# Patient Record
Sex: Male | Born: 1980 | Race: White | Hispanic: No | Marital: Married | Smoking: Current some day smoker
Health system: Southern US, Community
[De-identification: ages and names within clinical notes are randomized; demographics above are authoritative.]

## PROBLEM LIST (undated history)

## (undated) DIAGNOSIS — Q8789 Other specified congenital malformation syndromes, not elsewhere classified: Secondary | ICD-10-CM

## (undated) DIAGNOSIS — B1920 Unspecified viral hepatitis C without hepatic coma: Secondary | ICD-10-CM

## (undated) DIAGNOSIS — F1910 Other psychoactive substance abuse, uncomplicated: Secondary | ICD-10-CM

## (undated) DIAGNOSIS — G47419 Narcolepsy without cataplexy: Secondary | ICD-10-CM

## (undated) DIAGNOSIS — E785 Hyperlipidemia, unspecified: Secondary | ICD-10-CM

## (undated) DIAGNOSIS — Z9884 Bariatric surgery status: Secondary | ICD-10-CM

## (undated) DIAGNOSIS — L700 Acne vulgaris: Secondary | ICD-10-CM

## (undated) DIAGNOSIS — D72825 Bandemia: Secondary | ICD-10-CM

## (undated) DIAGNOSIS — E611 Iron deficiency: Secondary | ICD-10-CM

## (undated) DIAGNOSIS — R739 Hyperglycemia, unspecified: Secondary | ICD-10-CM

---

## 2002-10-22 ENCOUNTER — Emergency Department (HOSPITAL_COMMUNITY): Admission: EM | Admit: 2002-10-22 | Discharge: 2002-10-22 | Payer: Self-pay | Admitting: Emergency Medicine

## 2002-10-23 ENCOUNTER — Emergency Department (HOSPITAL_COMMUNITY): Admission: EM | Admit: 2002-10-23 | Discharge: 2002-10-23 | Payer: Self-pay | Admitting: Emergency Medicine

## 2005-05-18 ENCOUNTER — Emergency Department (HOSPITAL_COMMUNITY): Admission: EM | Admit: 2005-05-18 | Discharge: 2005-05-18 | Payer: Self-pay | Admitting: Emergency Medicine

## 2008-10-31 ENCOUNTER — Emergency Department (HOSPITAL_COMMUNITY): Admission: EM | Admit: 2008-10-31 | Discharge: 2008-10-31 | Payer: Self-pay | Admitting: Emergency Medicine

## 2016-01-07 ENCOUNTER — Other Ambulatory Visit: Payer: Self-pay | Admitting: Gastroenterology

## 2016-01-07 ENCOUNTER — Other Ambulatory Visit (HOSPITAL_COMMUNITY): Payer: Self-pay | Admitting: Gastroenterology

## 2016-01-07 DIAGNOSIS — B182 Chronic viral hepatitis C: Secondary | ICD-10-CM

## 2016-02-18 ENCOUNTER — Ambulatory Visit (HOSPITAL_COMMUNITY)
Admission: RE | Admit: 2016-02-18 | Discharge: 2016-02-18 | Disposition: A | Discharge status: Home / Self Care | Source: Ambulatory Visit | Attending: Gastroenterology | Admitting: Gastroenterology

## 2016-02-18 DIAGNOSIS — B182 Chronic viral hepatitis C: Secondary | ICD-10-CM

## 2016-03-24 ENCOUNTER — Ambulatory Visit (HOSPITAL_COMMUNITY)

## 2016-04-05 ENCOUNTER — Ambulatory Visit (HOSPITAL_COMMUNITY)
Admission: RE | Admit: 2016-04-05 | Discharge: 2016-04-05 | Disposition: A | Discharge status: Home / Self Care | Payer: Medicare Other | Source: Ambulatory Visit | Attending: Gastroenterology | Admitting: Gastroenterology

## 2016-04-05 DIAGNOSIS — B182 Chronic viral hepatitis C: Secondary | ICD-10-CM | POA: Diagnosis present

## 2017-07-23 ENCOUNTER — Emergency Department (HOSPITAL_COMMUNITY)
Admission: EM | Admit: 2017-07-23 | Discharge: 2017-07-23 | Disposition: A | Discharge status: Home / Self Care | Payer: Medicare Other | Attending: Emergency Medicine | Admitting: Emergency Medicine

## 2017-07-23 ENCOUNTER — Encounter (HOSPITAL_COMMUNITY): Payer: Self-pay | Admitting: Emergency Medicine

## 2017-07-23 ENCOUNTER — Emergency Department (HOSPITAL_COMMUNITY): Payer: Medicare Other

## 2017-07-23 DIAGNOSIS — R4182 Altered mental status, unspecified: Secondary | ICD-10-CM | POA: Diagnosis present

## 2017-07-23 DIAGNOSIS — Z79899 Other long term (current) drug therapy: Secondary | ICD-10-CM | POA: Diagnosis not present

## 2017-07-23 DIAGNOSIS — E785 Hyperlipidemia, unspecified: Secondary | ICD-10-CM | POA: Diagnosis not present

## 2017-07-23 DIAGNOSIS — R400 Somnolence: Secondary | ICD-10-CM | POA: Diagnosis not present

## 2017-07-23 HISTORY — DX: Hyperlipidemia, unspecified: E78.5

## 2017-07-23 HISTORY — DX: Iron deficiency: E61.1

## 2017-07-23 HISTORY — DX: Bariatric surgery status: Z98.84

## 2017-07-23 HISTORY — DX: Bandemia: D72.825

## 2017-07-23 HISTORY — DX: Hyperglycemia, unspecified: R73.9

## 2017-07-23 HISTORY — DX: Other specified congenital malformation syndromes, not elsewhere classified: Q87.89

## 2017-07-23 HISTORY — DX: Unspecified viral hepatitis C without hepatic coma: B19.20

## 2017-07-23 HISTORY — DX: Other psychoactive substance abuse, uncomplicated: F19.10

## 2017-07-23 HISTORY — DX: Narcolepsy without cataplexy: G47.419

## 2017-07-23 HISTORY — DX: Acne vulgaris: L70.0

## 2017-07-23 LAB — CBC WITH DIFFERENTIAL/PLATELET
Basophils Absolute: 0 10*3/uL (ref 0.0–0.1)
Basophils Relative: 0 %
Eosinophils Absolute: 0.3 10*3/uL (ref 0.0–0.7)
Eosinophils Relative: 5 %
HCT: 38.5 % — ABNORMAL LOW (ref 39.0–52.0)
Hemoglobin: 12 g/dL — ABNORMAL LOW (ref 13.0–17.0)
Lymphocytes Relative: 33 %
Lymphs Abs: 1.9 10*3/uL (ref 0.7–4.0)
MCH: 26.9 pg (ref 26.0–34.0)
MCHC: 31.2 g/dL (ref 30.0–36.0)
MCV: 86.3 fL (ref 78.0–100.0)
Monocytes Absolute: 0.3 10*3/uL (ref 0.1–1.0)
Monocytes Relative: 6 %
Neutro Abs: 3.3 10*3/uL (ref 1.7–7.7)
Neutrophils Relative %: 56 %
Platelets: 265 10*3/uL (ref 150–400)
RBC: 4.46 MIL/uL (ref 4.22–5.81)
RDW: 13.9 % (ref 11.5–15.5)
WBC: 5.8 10*3/uL (ref 4.0–10.5)

## 2017-07-23 LAB — COMPREHENSIVE METABOLIC PANEL
ALT: 20 U/L (ref 17–63)
AST: 31 U/L (ref 15–41)
Albumin: 3.4 g/dL — ABNORMAL LOW (ref 3.5–5.0)
Alkaline Phosphatase: 80 U/L (ref 38–126)
Anion gap: 3 — ABNORMAL LOW (ref 5–15)
BUN: 15 mg/dL (ref 6–20)
CO2: 30 mmol/L (ref 22–32)
Calcium: 8.4 mg/dL — ABNORMAL LOW (ref 8.9–10.3)
Chloride: 106 mmol/L (ref 101–111)
Creatinine, Ser: 1.09 mg/dL (ref 0.61–1.24)
GFR calc Af Amer: >60 mL/min (ref >60)
GFR calc non Af Amer: >60 mL/min (ref >60)
Glucose, Bld: 100 mg/dL — ABNORMAL HIGH (ref 65–99)
Potassium: 4 mmol/L (ref 3.5–5.1)
Sodium: 139 mmol/L (ref 135–145)
Total Bilirubin: 0.8 mg/dL (ref 0.3–1.2)
Total Protein: 6.3 g/dL — ABNORMAL LOW (ref 6.5–8.1)

## 2017-07-23 LAB — LACTIC ACID, PLASMA: Lactic Acid, Venous: 1.6 mmol/L (ref 0.5–1.9)

## 2017-07-23 LAB — CBG MONITORING, ED: Glucose-Capillary: 78 mg/dL (ref 65–99)

## 2017-07-23 LAB — AMMONIA: Ammonia: 27 umol/L (ref 9–35)

## 2017-07-23 MED ORDER — NALOXONE HCL 2 MG/2ML IJ SOSY
PREFILLED_SYRINGE | INTRAMUSCULAR | Status: AC
  Administered 2017-07-23: 2 mg
  Filled 2017-07-23: qty 2

## 2017-07-23 MED ORDER — SODIUM CHLORIDE 0.9 % IV SOLN: INTRAVENOUS | Status: DC

## 2017-07-23 MED ORDER — SODIUM CHLORIDE 0.9 % IV BOLUS (SEPSIS)
1000.0000 mL | Freq: Once | INTRAVENOUS | Status: AC
  Administered 2017-07-23: 1000 mL via INTRAVENOUS

## 2017-07-23 NOTE — ED Notes (Signed)
Patient able to ambulate independently, still somnolent

## 2017-07-23 NOTE — ED Triage Notes (Signed)
Per EMS:  Pt presents to the ED for assessment after family called for cardiac arrest.  Patient in respiratory arrest with strong HR with fire arrival.  IukaKing airway inserted by fire with 2mg  of Narcan given.  Patient given 5 of versed en route because he was fighting the tube.  Patient arrives breathing on his own, no tube in place, HR 100, BP 120/80, 99% on NRB.  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

## 2017-07-23 NOTE — ED Notes (Signed)
Pt provided with water, okay'd by EDP.  Monitored patient while drinking, no difficulties

## 2017-07-23 NOTE — ED Provider Notes (Signed)
[facility] EMERGENCY DEPARTMENT Provider Note   CSN: [id number] Arrival date & time: 07/23/17  1551     History   Chief Complaint Chief Complaint  Patient presents with  . unresponsive    HPI Seth Lopez is a 36 y.o. male.  The history is provided by the patient and the EMS personnel.  Altered Mental Status   This is a new problem. The current episode started less than 1 hour ago. The problem has been gradually improving. Associated symptoms include unresponsiveness. Associated symptoms comments: Respiratory arrest per FD. Risk factors include illicit drug use. Past medical history comments: IVDU, HepC.    Past Medical History:  Diagnosis Date  . Bandemia   . Cystic acne   . Drug abuse (HCC)   . Dyslipidemia   . Gastric bypass status for obesity   . Hepatitis C   . Hyperglycemia   . Iron deficiency   . Klein's syndrome (HCC)   . Narcolepsy     There are no active problems to display for this patient.   History reviewed. No pertinent surgical history.     Home Medications    Prior to Admission medications   Medication Sig Start Date End Date Taking? Authorizing Provider  amphetamine-dextroamphetamine (ADDERALL) 20 MG tablet Take 20-40 mg by mouth See admin instructions. 40mg  in the morning and 20mg  late afternoon 07/19/17  Yes [provider]  folic acid (FOLVITE) 1 MG tablet Take 1 mg by mouth daily. 06/20/17  Yes [provider]  NUVIGIL 150 MG tablet Take 300 mg by mouth daily. 05/23/17  Yes [provider]  omeprazole (PRILOSEC) 40 MG capsule Take 40 mg by mouth daily. 06/13/17  Yes [provider]  POLY-IRON 150 150 MG capsule Take 1 capsule by mouth 2 (two) times daily. 05/22/17  Yes [provider]    Family History History reviewed. No pertinent family history.  Social History Social History   Tobacco Use  . Smoking status: Unknown If Ever Smoked  Substance Use Topics  .  Alcohol use: Not on file    Comment: unknown  . Drug use: Yes    Types: Methamphetamines     Allergies   Hydromorphone   Review of Systems Review of Systems  Unable to perform ROS: Mental status change     Physical Exam Updated Vital Signs BP 114/70   Pulse 76   Resp 13   SpO2 99%   Physical Exam  Constitutional: He appears well-developed and well-nourished.  HENT:  Head: Normocephalic and atraumatic.  Eyes: Conjunctivae are normal.  Pupils 1mm b/l  Neck: Neck supple.  Cardiovascular: Regular rhythm.  No murmur heard. Tachycardic  Pulmonary/Chest: Effort normal and breath sounds normal. No respiratory distress.  Abdominal: Soft. There is no tenderness.  Musculoskeletal: He exhibits no edema.  Neurological:  Sedated, minimally responsive to voice, moves all 4ext  Skin: Skin is warm and dry.  Psychiatric: He has a normal mood and affect.  Nursing note and vitals reviewed.    ED Treatments / Results  Labs (all labs ordered are listed, but only abnormal results are displayed) Labs Reviewed  COMPREHENSIVE METABOLIC PANEL - Abnormal; Notable for the following components:      Result Value   Glucose, Bld 100 (*)    Calcium 8.4 (*)    Total Protein 6.3 (*)    Albumin 3.4 (*)    Anion gap 3 (*)    All other components within normal limits  CBC WITH  DIFFERENTIAL/PLATELET - Abnormal; Notable for the following components:   Hemoglobin 12.0 (*)    HCT 38.5 (*)    All other components within normal limits  AMMONIA  LACTIC ACID, PLASMA  URINALYSIS, COMPLETE (UACMP) WITH MICROSCOPIC  CBG MONITORING, ED    EKG  EKG Interpretation  Date/Time:  Sunday July 23 2017 15:53:28 EST Ventricular Rate:  100 PR Interval:    QRS Duration: 89 QT Interval:  342 QTC Calculation: 442 R Axis:   87 Text Interpretation:  Sinus tachycardia Confirmed by Blane OharaZavitz, Joshua [phone]) on 07/23/2017 4:09:24 PM       Radiology Dg Chest Portable 1 View  Result Date:  07/23/2017 CLINICAL DATA:  Cardiac arrest.  Narcan given. EXAM: PORTABLE CHEST 1 VIEW COMPARISON:  06/01/2016 FINDINGS: Lungs are somewhat hypoinflated and otherwise clear. Cardiomediastinal silhouette, bones and soft tissues are within normal. IMPRESSION: No active disease. Electronically Signed   By: Elberta Fortisaniel  Boyle M.D.   On: 07/23/2017 16:45    Procedures Procedures (including critical care time)  Medications Ordered in ED Medications  sodium chloride 0.9 % bolus 1,000 mL (0 mLs Intravenous Stopped 07/23/17 1758)    And  0.9 %  sodium chloride infusion (not administered)  naloxone (NARCAN) 2 MG/2ML injection (2 mg  Given 07/23/17 1557)     Initial Impression / Assessment and Plan / ED Course  I have reviewed the triage vital signs and the nursing notes.  Pertinent labs & imaging results that were available during my care of the patient were reviewed by me and considered in my medical decision making (see chart for details).     Pt with h/o IVDU, HepC presents after respiratory arrest. Family reportedly came home and found that the Pt wasn't breathing; they called 911 and FD was sent out for presumed cardiac arrest. When FD arrived the Pt had a pulse, but was in reportedly in respiratory arrest; they inserted a King airway & gave 2mg  Narcan which improved his respiratory status. When EMS arrived, the Pt was still minimally responsive, but when they loaded him into the ambulance, he sat upright with the [facility] in [facility], so they gave 5mg  Versed to sedate him; the Brooke DareKing was removed & the Pt was saturating well on RA, but they placed him on an NRB per protocol.  VS & exam as above. Pt minimally responsive on arrival, so 2mg  Narcan given in the ED; questionable response, but became more talkative. EKG: ST @ 100bpm w/normal intervals and no signs of ischemia.  NS bolus & MIVF started. Mom arrived in the ED & provided PMHx (narcolepsy & Kleine-Levin syndrome) and med list. She says the Pt's friend  reportedly gave him "a Valium" today and is concerned that this might be what contributed to his presentation.  CXR WNL. Labs unremarkable.  On re-evaluation, Pt still sleepy, but easily arousible. Says that he's just sleepy and that this isn't uncommon given his condition which his parents confirm. Do not believe there is any emergent condition that required intervention at this time; presentation likely related to his chronic condition & might have been exacerbated by the benzodiazepine.  Explained all results to the Pt & parents. Will discharge the Pt home. Recommending follow-up with PCP. ED return precautions provided. Pt acknowledged understanding of, and concurrence with the plan. All questions answered to his satisfaction. In stable condition at the time of discharge.  Final Clinical Impressions(s) / ED Diagnoses   Final diagnoses:  Somnolence    ED Discharge Orders  None       Forest BeckerPetit, Juliauna Stueve, MD 07/23/17 Gayla Medicus1949    Blane OharaZavitz, Joshua, MD 07/23/17 [phone]

## 2017-07-23 NOTE — ED Notes (Addendum)
Response to narcan includes patient asking for water and being more alert.  No distinct response.  Patient denies any pain, denies any other symptoms prior to incident except "laying in bed all day" and "feeling nauseous".  Pt states "I didn't do anything to cause this".  Patient asking why he has received narcan, explained, patient verbalized understanding.

## 2017-07-23 NOTE — ED Notes (Signed)
Patient continues to ask for water.  Made aware that he cannot drink.   Patient;'s mother here.  Pt verbalized okay for mother to come to room.

## 2017-12-27 ENCOUNTER — Other Ambulatory Visit: Payer: Self-pay

## 2017-12-27 ENCOUNTER — Encounter (HOSPITAL_COMMUNITY): Payer: Self-pay

## 2017-12-27 ENCOUNTER — Emergency Department (HOSPITAL_COMMUNITY)
Admission: EM | Admit: 2017-12-27 | Discharge: 2017-12-27 | Disposition: A | Discharge status: Home / Self Care | Payer: Medicare Other | Attending: Emergency Medicine | Admitting: Emergency Medicine

## 2017-12-27 DIAGNOSIS — Z79899 Other long term (current) drug therapy: Secondary | ICD-10-CM | POA: Diagnosis not present

## 2017-12-27 DIAGNOSIS — Z046 Encounter for general psychiatric examination, requested by authority: Secondary | ICD-10-CM | POA: Insufficient documentation

## 2017-12-27 DIAGNOSIS — R4182 Altered mental status, unspecified: Secondary | ICD-10-CM | POA: Diagnosis present

## 2017-12-27 DIAGNOSIS — F1910 Other psychoactive substance abuse, uncomplicated: Secondary | ICD-10-CM | POA: Diagnosis not present

## 2017-12-27 DIAGNOSIS — Z9884 Bariatric surgery status: Secondary | ICD-10-CM | POA: Diagnosis not present

## 2017-12-27 DIAGNOSIS — F17210 Nicotine dependence, cigarettes, uncomplicated: Secondary | ICD-10-CM | POA: Insufficient documentation

## 2017-12-27 NOTE — ED Triage Notes (Signed)
Per EMS- Patient reports that he did LSD and is not sure if he did any meth. Patient paranoid and could not remember where his car was. Patient was in a neighborhood banging on doors and going through [employer]. [employer] was called.

## 2017-12-27 NOTE — ED Provider Notes (Signed)
[facility]-EMERGENCY DEPT Provider Note   CSN: [id number] Arrival date & time: 12/27/17  0815     History   Chief Complaint Chief Complaint  Patient presents with  . Altered Mental Status    HPI Seth Lopez is a 37 y.o. male.  HPI   Patient brought in by police because he was banging on doors.  He was up all night, doing "drugs,", and states that he had some "bad drugs."  He denies headache, chest pain, weakness, dizziness, nausea or vomiting.  He states he is currently visiting [place], and spends time both in [place] and [place].  Expresses no additional desires or concern.  Law enforcement is not pursuing charges against the patient.  There are no other no modifying factors.  Past Medical History:  Diagnosis Date  . Bandemia   . Cystic acne   . Drug abuse (HCC)   . Dyslipidemia   . Gastric bypass status for obesity   . Hepatitis C   . Hyperglycemia   . Iron deficiency   . Klein's syndrome   . Narcolepsy     There are no active problems to display for this patient.   History reviewed. No pertinent surgical history.      Home Medications    Prior to Admission medications   Medication Sig Start Date End Date Taking? Authorizing Provider  amphetamine-dextroamphetamine (ADDERALL) 20 MG tablet Take 20-40 mg by mouth See admin instructions.  in the morning and  late afternoon 07/19/17   [provider]  folic acid (FOLVITE) 1 MG tablet Take 1 mg by mouth daily. 06/20/17   [provider]  NUVIGIL 150 MG tablet Take 300 mg by mouth daily. 05/23/17   [provider]  omeprazole (PRILOSEC) 40 MG capsule Take 40 mg by mouth daily. 06/13/17   [provider]  POLY-IRON 150 150 MG capsule Take 1 capsule by mouth 2 (two) times daily. 05/22/17   [provider]    Family History History reviewed. No pertinent family history.  Social History Social History   Tobacco  Use  . Smoking status: Current Some Day Smoker    Types: Cigarettes  . Smokeless tobacco: Never Used  Substance Use Topics  . Alcohol use: Not Currently    Comment: unknown  . Drug use: Yes    Types: Methamphetamines, IV    Comment: Patient admits to doing LSD as well     Allergies   Hydromorphone   Review of Systems Review of Systems  All other systems reviewed and are negative.    Physical Exam Updated Vital Signs BP (!) 143/94 (BP Location: Left Arm)   Pulse (!) 105   Temp 98 F (36.7 C) (Oral)   Resp 17   Ht  (1.854 m)   Wt 79.4 kg (175 lb)   SpO2 99%   BMI 23.09 kg/m   Physical Exam  Constitutional: He is oriented to person, place, and time. He appears well-developed and well-nourished. No distress.  HENT:  Head: Normocephalic and atraumatic.  Right Ear: External ear normal.  Left Ear: External ear normal.  Eyes: Pupils are equal, round, and reactive to light. Conjunctivae and EOM are normal.  Neck: Normal range of motion and phonation normal. Neck supple.  Cardiovascular: Normal rate.  Pulmonary/Chest: Effort normal. He exhibits no bony tenderness.  Musculoskeletal: Normal range of motion.  Neurological: He is alert and oriented to person, place, and time. No cranial nerve deficit or sensory deficit.  He exhibits normal muscle tone. Coordination normal.  Lucid.  No dysarthria or aphasia.  Skin: Skin is warm, dry and intact.  Psychiatric: He has a normal mood and affect. His behavior is normal. Judgment and thought content normal.  Cooperative and interactive  Nursing note and vitals reviewed.    ED Treatments / Results  Labs (all labs ordered are listed, but only abnormal results are displayed) Labs Reviewed - No data to display  EKG None  Radiology No results found.  Procedures Procedures (including critical care time)  Medications Ordered in ED Medications - No data to display   Initial Impression / Assessment and Plan / ED Course    I have reviewed the triage vital signs and the nursing notes.  Pertinent labs & imaging results that were available during my care of the patient were reviewed by me and considered in my medical decision making (see chart for details).      Patient Vitals for the past 24 hrs:  BP Temp Temp src Pulse Resp SpO2 Height Weight  12/27/17 0840 - - - - - -  (1.854 m) 79.4 kg (175 lb)  12/27/17 0825 - - - - - 99 % - -  12/27/17 0820 (!) 143/94 98 F (36.7 C) Oral (!) 105 17 100 % - -    9:16 AM Reevaluation with update and discussion. After initial assessment and treatment, an updated evaluation reveals no change in status, findings discussed with patient and all questions answered. Mancel Bale   Medical Decision Making: Intoxication with psychoactive medication.  Patient has improved after arrival to the ED and there is no indication for further evaluation and treatment.  CRITICAL CARE-no Performed by: Mancel Bale  Nursing Notes Reviewed/ Care Coordinated Applicable Imaging Reviewed Interpretation of Laboratory Data incorporated into ED treatment  The patient appears reasonably screened and/or stabilized for discharge and I doubt any other medical condition or other [facility] requiring further screening, evaluation, or treatment in the ED at this time prior to discharge.  Plan: Home Medications-OTC as needed; Home Treatments-rest, fluids, avoid drugs; return here if the recommended treatment, does not improve the symptoms; Recommended follow up-PCP prn    Final Clinical Impressions(s) / ED Diagnoses   Final diagnoses:  None    ED Discharge Orders    None       Mancel Bale, MD 12/27/17 [phone]

## 2017-12-27 NOTE — Discharge Instructions (Addendum)
Avoid using illegal drugs.  

## 2017-12-31 ENCOUNTER — Ambulatory Visit (HOSPITAL_COMMUNITY)
Admission: RE | Admit: 2017-12-31 | Discharge: 2017-12-31 | Disposition: A | Discharge status: Home / Self Care | Payer: Medicare Other | Attending: Psychiatry | Admitting: Psychiatry

## 2017-12-31 DIAGNOSIS — F329 Major depressive disorder, single episode, unspecified: Secondary | ICD-10-CM | POA: Diagnosis not present

## 2017-12-31 DIAGNOSIS — F1910 Other psychoactive substance abuse, uncomplicated: Secondary | ICD-10-CM | POA: Insufficient documentation

## 2017-12-31 DIAGNOSIS — G4700 Insomnia, unspecified: Secondary | ICD-10-CM | POA: Insufficient documentation

## 2017-12-31 DIAGNOSIS — R451 Restlessness and agitation: Secondary | ICD-10-CM | POA: Diagnosis not present

## 2017-12-31 NOTE — BH Assessment (Signed)
Assessment Note  Seth Lopez is an 37 y.o. single male who presents to [facility] accompanied by his mother, who participated in assessment at Pt's request. Pt says he is seeking substance abuse treatment. He reports he has been using methamphetamines and has not used in 47 days. He describes maintaining sobriety as a struggle and says he has been "shooting water", injecting water intravenously, because the ritual makes staying clean easier. Pt says he has still been using marijuana 1-2 times per week. He also says he used LSD four days ago and this is another reason he fears he is going to relapse on methamphetamines. He says he doesn't drink alcohol to intoxication. He denies other substance use. Pt says he wants to be admitted to a residential substance abuse treatment facility.  Pt describes his mood recently at "up and down" and Pt's mother agrees that Pt has been sad and irritable. Pt acknowledges symptoms including crying spells, social withdrawal, loss of interest in usual pleasures, fatigue, irritability, decreased concentration, erratic sleep and feelings of guilt and hopelessness. He denies current suicidal ideation or any history of suicide attempts. Protective factors against suicide include good family support, future orientation, therapeutic relationship with physicians, no access to firearms and no prior attempts. He denies current homicidal ideation or history of violence. He denies auditory or visual hallucination except when he is high. Pt's mother does not identify any safety concerns but is worried about Pt's mood and possibly relapsing.   Pt identifies his primary stressor as maintaining sobriety. He also says his girlfriend has been in [place] the past 90 days. He says his parents are moving and that is also stressful. Pt has multiple medical problems (see below) and has been diagnosed with narcolepsy. He says he has a neurologist  and a sleep specialist. Pt lives alone and is on disability. He identifies his mother, sister and girlfriend as his primary supports. Pt denies any history of inpatient or outpatient mental health or substance abuse treatment.  Pt is casually dressed, alert and oriented x4. Pt speaks in a clear tone, at moderate volume and normal pace. Motor behavior appears normal. Eye contact is good and Pt is at times tearful. Pt's mood is depressed, anxious and affect is congruent with mood. Thought process is coherent and relevant. There is no indication Pt is currently responding to internal stimuli or experiencing delusional thought content. Pt was cooperative throughout assessment.   Diagnosis: F15.20 Amphetamine-type substance use disorder, Severe  Past Medical History:  Past Medical History:  Diagnosis Date  . Bandemia   . Cystic acne   . Drug abuse (HCC)   . Dyslipidemia   . Gastric bypass status for obesity   . Hepatitis C   . Hyperglycemia   . Iron deficiency   . Klein's syndrome   . Narcolepsy     No past surgical history on file.  Family History: No family history on file.  Social History:  reports that he has been smoking cigarettes.  He has never used smokeless tobacco. He reports that he drank alcohol. He reports that he has current or past drug history. Drugs: Methamphetamines and IV.  Additional Social History:  Alcohol / Drug Use Pain Medications: Pt denies Prescriptions: Poly Iron, Omeprazole, Folic Acid Over the Counter: None History of alcohol / drug use?: Yes Longest period of sobriety (when/how long): 47 days currently Negative Consequences of Use: Personal relationships Withdrawal Symptoms: (Pt denies current withdrawal) Substance #  1 Name of Substance 1: Methamphetamines 1 - Age of First Use: 22 1 - Amount (size/oz): varies 1 - Frequency: Was using daily 1 - Duration: Several years 1 - Last Use / Amount: Last used 47 days ago Substance #2 Name of Substance 2:  Marijuana 2 - Age of First Use: 16 2 - Amount (size/oz): 1 bowl 2 - Frequency: 1-2 times per week  2 - Duration: Ongoing 2 - Last Use / Amount: 12/28/17 Substance #3 Name of Substance 3: LSD 3 - Age of First Use: 22 3 - Amount (size/oz): Varies 3 - Frequency: Infrequently 3 - Duration: Off and on for years 3 - Last Use / Amount: 12/28/17  CIWA:   COWS:    Allergies:  Allergies  Allergen Reactions  . Hydromorphone Other (See Comments)    hallucination    Home Medications:  (Not in a hospital admission)  OB/GYN Status:  No LMP for male patient.  General Assessment Data Location of Assessment: [facility] Assessment Services TTS Assessment: In system Is this a Tele or Face-to-Face Assessment?: Face-to-Face Is this an Initial Assessment or a Re-assessment for this encounter?: Initial Assessment Marital status: Single Maiden name: NA Is patient pregnant?: No Pregnancy Status: No Living Arrangements: Alone Can pt return to current living arrangement?: Yes Admission Status: Voluntary Is patient capable of signing voluntary admission?: Yes Referral Source: Self/Family/Friend Insurance type: Medicare, [place], Medicaid  Medical Screening Exam [facility] Walk-in ONLY) Medical Exam completed: Teacher, early years/pre, PA)  Crisis Care Plan Living Arrangements: Alone Legal Guardian: Other:(Self) Name of Psychiatrist: None Name of Therapist: None  Education Status Is patient currently in school?: No Is the patient employed, unemployed or receiving disability?: Receiving disability income  Risk to self with the past 6 months Suicidal Ideation: No Has patient been a risk to self within the past 6 months prior to admission? : No Suicidal Intent: No Has patient had any suicidal intent within the past 6 months prior to admission? : No Is patient at risk for suicide?: No Suicidal Plan?: No Has patient had any suicidal plan within the past 6 months prior to admission? : No Access to Means:  No What has been your use of drugs/alcohol within the last 12 months?: Pt has history of using meth, marijuana, LSD Previous Attempts/Gestures: No How many times?: 0 Other Self Harm Risks: None Triggers for Past Attempts: None known Intentional Self Injurious Behavior: None Family Suicide History: No Recent stressful life event(s): Other (Comment)(Parents relocating, girlfriend in residential tx) Persecutory voices/beliefs?: No Depression: Yes Depression Symptoms: Despondent, Tearfulness, Isolating, Fatigue, Guilt, Loss of interest in usual pleasures, Feeling angry/irritable Substance abuse history and/or treatment for substance abuse?: Yes Suicide prevention information given to non-admitted patients: Not applicable  Risk to Others within the past 6 months Homicidal Ideation: No Does patient have any lifetime risk of violence toward others beyond the six months prior to admission? : No Thoughts of Harm to Others: No Current Homicidal Intent: No Current Homicidal Plan: No Access to Homicidal Means: No Identified Victim: None History of harm to others?: No Assessment of Violence: None Noted Violent Behavior Description: Pt denies history of violence Does patient have access to weapons?: No Criminal Charges Pending?: No Does patient have a court date: No Is patient on probation?: No  Psychosis Hallucinations: None noted Delusions: None noted  Mental Status Report Appearance/Hygiene: Other (Comment)(Casually dressed) Eye Contact: Fair Motor Activity: Unremarkable Speech: Logical/coherent Level of Consciousness: Alert Mood: Depressed, Anxious, Irritable Affect: Anxious Anxiety Level: Moderate Thought Processes:  Coherent, Relevant Judgement: Unimpaired Orientation: Person, Place, Time, Situation, Appropriate for developmental age Obsessive Compulsive Thoughts/Behaviors: None  Cognitive Functioning Concentration: Normal Memory: Recent Intact, Remote Intact Is patient  IDD: No Is patient DD?: No Insight: Fair Impulse Control: Good Appetite: Fair Have you had any weight changes? : No Change Sleep: No Change Total Hours of Sleep: 8 Vegetative Symptoms: None  ADLScreening [facility] Assessment Services) Patient's cognitive ability adequate to safely complete daily activities?: Yes Patient able to express need for assistance with ADLs?: Yes Independently performs ADLs?: Yes (appropriate for developmental age)  Prior Inpatient Therapy Prior Inpatient Therapy: No  Prior Outpatient Therapy Prior Outpatient Therapy: No Does patient have an ACCT team?: No Does patient have Intensive In-House Services?  : No Does patient have [facility] services? : No Does patient have P4CC services?: No  ADL Screening (condition at time of admission) Patient's cognitive ability adequate to safely complete daily activities?: Yes Is the patient deaf or have difficulty hearing?: No Does the patient have difficulty seeing, even when wearing glasses/contacts?: No Does the patient have difficulty concentrating, remembering, or making decisions?: No Patient able to express need for assistance with ADLs?: Yes Does the patient have difficulty dressing or bathing?: No Independently performs ADLs?: Yes (appropriate for developmental age) Does the patient have difficulty walking or climbing stairs?: No Weakness of Legs: None Weakness of Arms/Hands: None  Home Assistive Devices/Equipment Home Assistive Devices/Equipment: None    Abuse/Neglect Assessment (Assessment to be complete while patient is alone) Abuse/Neglect Assessment Can Be Completed: Yes Physical Abuse: Denies Verbal Abuse: Denies Sexual Abuse: Denies Exploitation of patient/patient's resources: Denies Self-Neglect: Denies     Merchant navy officer (For Healthcare) Does Patient Have a Medical Advance Directive?: No Would patient like information on creating a medical advance directive?: No - Patient declined     Additional Information 1:1 In Past 12 Months?: No CIRT Risk: No Elopement Risk: No Does patient have medical clearance?: No     Disposition: Gave clinical report to Donell Sievert, PA who completed MSE and recommended Pt be referred to residential substance abuse treatment centers. Pt give list of residential treatment centers and states he will follow up with them tomorrow.  Disposition Initial Assessment Completed for this Encounter: Yes Disposition of Patient: Discharge Patient refused recommended treatment: No Mode of transportation if patient is discharged?: Car Patient referred to: Other (Comment)(Substance abuse residential treatment)  On Site Evaluation by:  Donell Sievert, PA Reviewed with Physician:    Pamalee Leyden, [facility], [facility], [facility] Triage Specialist [phone]  Pamalee Leyden 12/31/2017 10:48 PM

## 2018-01-01 NOTE — H&P (Signed)
Behavioral Health Medical Screening Exam  Seth CarawayJonathan Ryan Henreitta Lopez is an 37 y.o. male, accompanied by a male associate, here to seek guidance pursuant to a history of polysubstance abuse and requesting admission to a 30 day treatment facility, out of fears of repeated use of drugs and alcohol. He is denying any SI/SA or HI. He also denied any self harm and or acute ailments or acute/chronic comorbid conditions.  Total Time spent with patient: 15 minutes  Psychiatric Specialty Exam: Physical Exam  Constitutional: He is oriented to person, place, and time. He appears well-developed and well-nourished. No distress.  HENT:  Head: Normocephalic.  Eyes: Pupils are equal, round, and reactive to light.  Respiratory: Effort normal and breath sounds normal.  Neurological: He is alert and oriented to person, place, and time. No cranial nerve deficit.  Skin: Skin is warm and dry. He is not diaphoretic.  Psychiatric: His speech is normal. His mood appears anxious. His affect is angry. He is agitated. Cognition and memory are normal. He expresses impulsivity. He exhibits a depressed mood. He expresses no homicidal and no suicidal ideation. He expresses no suicidal plans and no homicidal plans.    Review of Systems  Constitutional: Negative for chills, diaphoresis, fever, malaise/fatigue and weight loss.  Respiratory: Negative for shortness of breath.   Cardiovascular: Negative for chest pain.  Gastrointestinal: Negative for heartburn and nausea.  Neurological: Negative for dizziness.  Psychiatric/Behavioral: Positive for depression and substance abuse. Negative for hallucinations and suicidal ideas. The patient is nervous/anxious and has insomnia.     There were no vitals taken for this visit.There is no height or weight on file to calculate BMI.  General Appearance: Disheveled  Eye Contact:  Poor  Speech:  Clear and Coherent  Volume:  Normal  Mood:  Irritable  Affect:  Congruent  Thought Process:   Goal Directed  Orientation:  Full (Time, Place, and Person)  Thought Content:  Logical  Suicidal Thoughts:  No  Homicidal Thoughts:  No  Memory:  Immediate;   Fair  Judgement:  Fair  Insight:  Fair  Psychomotor Activity:  Normal  Concentration: Concentration: Fair  Recall:  [employer]Fair  Fund of Knowledge:Fair  Language: Good  Akathisia:  No  Handed:  Right  AIMS (if indicated):     Assets:  Desire for Improvement  Sleep:       Musculoskeletal: Strength & Muscle Tone: within normal limits Gait & Station: normal Patient leans: N/A  There were no vitals taken for this visit.  Recommendations:  Based on my evaluation the patient does not appear to have an emergency medical condition.  Seth HoughSpencer E Hezakiah Champeau, PA-C 01/01/2018, 12:09 AM

## 2018-09-30 ENCOUNTER — Ambulatory Visit (INDEPENDENT_AMBULATORY_CARE_PROVIDER_SITE_OTHER)

## 2018-09-30 ENCOUNTER — Ambulatory Visit (HOSPITAL_COMMUNITY)
Admission: EM | Admit: 2018-09-30 | Discharge: 2018-09-30 | Disposition: A | Discharge status: Home / Self Care | Attending: Family Medicine | Admitting: Family Medicine

## 2018-09-30 ENCOUNTER — Telehealth (HOSPITAL_COMMUNITY): Payer: Self-pay | Admitting: *Deleted

## 2018-09-30 ENCOUNTER — Encounter (HOSPITAL_COMMUNITY): Payer: Self-pay | Admitting: Emergency Medicine

## 2018-09-30 DIAGNOSIS — M25561 Pain in right knee: Secondary | ICD-10-CM

## 2018-09-30 DIAGNOSIS — S82041A Displaced comminuted fracture of right patella, initial encounter for closed fracture: Secondary | ICD-10-CM

## 2018-09-30 DIAGNOSIS — M25461 Effusion, right knee: Secondary | ICD-10-CM

## 2018-09-30 MED ORDER — HYDROCODONE-ACETAMINOPHEN 5-325 MG PO TABS
ORAL_TABLET | ORAL | Status: AC
  Filled 2018-09-30: qty 1

## 2018-09-30 MED ORDER — HYDROCODONE-ACETAMINOPHEN 5-325 MG PO TABS: 2.0000 | ORAL_TABLET | ORAL | 0 refills | Status: AC | PRN

## 2018-09-30 MED ORDER — HYDROCODONE-ACETAMINOPHEN 5-325 MG PO TABS
1.0000 | ORAL_TABLET | Freq: Once | ORAL | Status: AC
  Administered 2018-09-30: 1 via ORAL

## 2018-09-30 NOTE — ED Triage Notes (Signed)
Pt states yesterday morning he tripped over a TV and landed on his R knee on the hard ground. Pt states difficult to put any weight on his leg. States its worse than yesterday.

## 2018-09-30 NOTE — ED Provider Notes (Signed)
[facility]   [id number] 09/30/18 Arrival Time: 1020  CC: Right knee pain  SUBJECTIVE: History from: patient. Seth Lopez is a 38 y.o. male complains of right knee pain that began yesterday.  Tripped over a TV and struck right knee on the asphalt.  Pain is diffuse about the knee including and anterior and posterior aspect.  Describes the pain as constant.  Has tried OTC medications and icing with minimal relief.  Symptoms are made worse with weight-bearing.  Denies similar symptoms in the past.  Complains of associated swelling.  Denies fever, chills, erythema, ecchymosis, weakness, numbness and tingling.      ROS: As per HPI.  Past Medical History:  Diagnosis Date  . Bandemia   . Cystic acne   . Drug abuse (HCC)   . Dyslipidemia   . Gastric bypass status for obesity   . Hepatitis C   . Hyperglycemia   . Iron deficiency   . Klein's syndrome   . Narcolepsy    History reviewed. No pertinent surgical history. Allergies  Allergen Reactions  . Hydromorphone Other (See Comments)    hallucination   No current facility-administered medications on file prior to encounter.    Current Outpatient Medications on File Prior to Encounter  Medication Sig Dispense Refill  . folic acid (FOLVITE) 1 MG tablet Take 1 mg by mouth daily.  1  . NUVIGIL 150 MG tablet Take 300 mg by mouth daily.  5  . POLY-IRON 150 150 MG capsule Take 1 capsule by mouth 2 (two) times daily.  3   Social History   Socioeconomic History  . Marital status: Married    Spouse name: Not on file  . Number of children: Not on file  . Years of education: Not on file  . Highest education level: Not on file  Occupational History  . Not on file  Social Needs  . Financial resource strain: Not on file  . Food insecurity:    Worry: Not on file    Inability: Not on file  . Transportation needs:    Medical: Not on file    Non-medical: Not on file  Tobacco Use  . Smoking status: Current Some Day  Smoker    Types: Cigarettes  . Smokeless tobacco: Never Used  Substance and Sexual Activity  . Alcohol use: Not Currently    Comment: unknown  . Drug use: Yes    Types: Methamphetamines, IV    Comment: Patient admits to doing LSD as well  . Sexual activity: Not on file  Lifestyle  . Physical activity:    Days per week: Not on file    Minutes per session: Not on file  . Stress: Not on file  Relationships  . Social connections:    Talks on phone: Not on file    Gets together: Not on file    Attends religious service: Not on file    Active member of club or organization: Not on file    Attends meetings of clubs or organizations: Not on file    Relationship status: Not on file  . Intimate partner violence:    Fear of current or ex partner: Not on file    Emotionally abused: Not on file    Physically abused: Not on file    Forced sexual activity: Not on file  Other Topics Concern  . Not on file  Social History Narrative  . Not on file   No family history on file.  OBJECTIVE:  Vitals:   09/30/18 1042  BP: 132/84  Pulse: (!) 112  Resp: 18  Temp: 97.9 F (36.6 C)  SpO2: 100%    General appearance: Alert; appears uncomfortable sitting in wheelchair Head: NCAT Lungs: normal respiratory effort CV: Dorsalis pedis pulse 2+ RT foot Musculoskeletal: Right knee Inspection: Skin warm, dry, clear and intact.  Mild to moderate knee effusion present.  Small abrasion to anterior aspect of patella Palpation: Diffusely TTP about anterior and posterior knee ROM: LROM Strength: deferred due to discomfort Skin: warm and dry Neurologic: Sitting in wheel chair; Sensation intact about the lower extremities Psychological: alert and cooperative; normal mood and affect  DIAGNOSTIC STUDIES:  Dg Knee Complete 4 Views Right  Result Date: 09/30/2018 CLINICAL DATA:  Tripped and fell onto right knee. Right knee pain. Initial encounter. EXAM: RIGHT KNEE - COMPLETE 4+ VIEW COMPARISON:  None.  FINDINGS: A comminuted, mildly displaced fracture of the patella is seen. Moderate knee joint effusion also demonstrated. No other fractures identified. No evidence of dislocation. IMPRESSION: 1. Comminuted, mildly displaced fracture of the patella. 2. Moderate knee joint effusion. Electronically Signed   By: Myles Rosenthal M.D.   On: 09/30/2018 11:30     MDM:  Pt presenting with 1 day of knee pain following direct fall on patella.  VS significant for tachycardia.  Appears uncomfortable, sitting in wheelchair. TTP over anterior and posterior aspect of knee.  Dorsalis pedis pulse 2+ and intact.  Sensation intact.  Wiggles toes without difficulty.  X-ray showed comminuted mildly displaced patella fracture.  Norco given in office to address pain.  Discussed patient case with Dr. Charlann Boxer, on call orthopedist.  Recommended knee immobilizer, crutches and follow up this week.  Dr. Charlann Boxer called back after patient was discharged and offered patient earlier appointment on Monday with Dr. Nilsa Nutting partner, Dr. Aundria Rud.  Patient called and made aware by RN Charlesetta Garibaldi.  Pt will call first thing Monday morning for appointment with Dr. Aundria Rud.  Pt will use ibuprofen and tylenol as needed for pain.  Norco for severe break-through pain.  Return and ED precautions given.    ASSESSMENT & PLAN:  1. Closed displaced comminuted fracture of right patella, initial encounter   2. Effusion of right knee    Meds ordered this encounter  Medications  . HYDROcodone-acetaminophen (NORCO/VICODIN) 5-325 MG per tablet 1 tablet  . HYDROcodone-acetaminophen (NORCO/VICODIN) 5-325 MG tablet    Sig: Take 2 tablets by mouth every 4 (four) hours as needed for moderate pain or severe pain.    Dispense:  10 tablet    Refill:  0    Order Specific Question:   Supervising Provider    Answer:   Eustace Moore [[id number]]   Norco given in office.  X-rays showed patella fracture Knee immobilizer and crutches given in office.  Wear at all times    Remain non-weight-bearing on RT leg until cleared by ortho Continue conservative management of rest, ice, and elevation  Take ibuprofen and/or tylenol as needed for pain relief (may cause abdominal discomfort, ulcers, and GI bleeds avoid taking with other NSAIDs) You may use norco as needed for break through pain.  DO NOT DRIVE OR OPERATE HEAVY MACHINERY WHILE TAKING THIS MEDICATION Follow up with orthopedist on Wednesday for further evaluation and management Return or go to the ER if you have any new or worsening symptoms (fever, chills, chest pain, severe pain, abdominal pain, changes in bowel or bladder habits, pain radiating into lower legs, pallor, sensation changes, etc...)  Reviewed expectations re: course of current medical issues. Questions answered. Outlined signs and symptoms indicating need for more acute intervention. Patient verbalized understanding. After Visit Summary given.    Rennis Harding, PA-C 09/30/18 1846

## 2018-09-30 NOTE — Discharge Instructions (Signed)
X-rays showed patella fracture Knee immobilizer and crutches given in office.  Wear at all times  Remain non-weight-bearing on RT leg until cleared by ortho Continue conservative management of rest, ice, and elevation  Take ibuprofen and/or tylenol as needed for pain relief (may cause abdominal discomfort, ulcers, and GI bleeds avoid taking with other NSAIDs) You may use norco as needed for break through pain.  DO NOT DRIVE OR OPERATE HEAVY MACHINERY WHILE TAKING THIS MEDICATION Follow up with orthopedist on Wednesday for further evaluation and management Return or go to the ER if you have any new or worsening symptoms (fever, chills, chest pain, severe pain, abdominal pain, changes in bowel or bladder habits, pain radiating into lower legs, etc...)

## 2018-09-30 NOTE — Telephone Encounter (Signed)
Per Dr Nilsa Nutting request, pt notified to call their office first thing tomorrow morning, as Dr Aundria Rud will get pt seen sooner.  Pt notified and verbalized understanding.

## 2019-07-05 IMAGING — DX DG KNEE COMPLETE 4+V*R*
4 series · 4 of 4 positions shown · non-contrast
Comparison: None.

CLINICAL DATA: Tripped and fell onto right knee. Right knee pain.
Initial encounter.

EXAM:
RIGHT KNEE - COMPLETE 4+ VIEW

[knee ap]
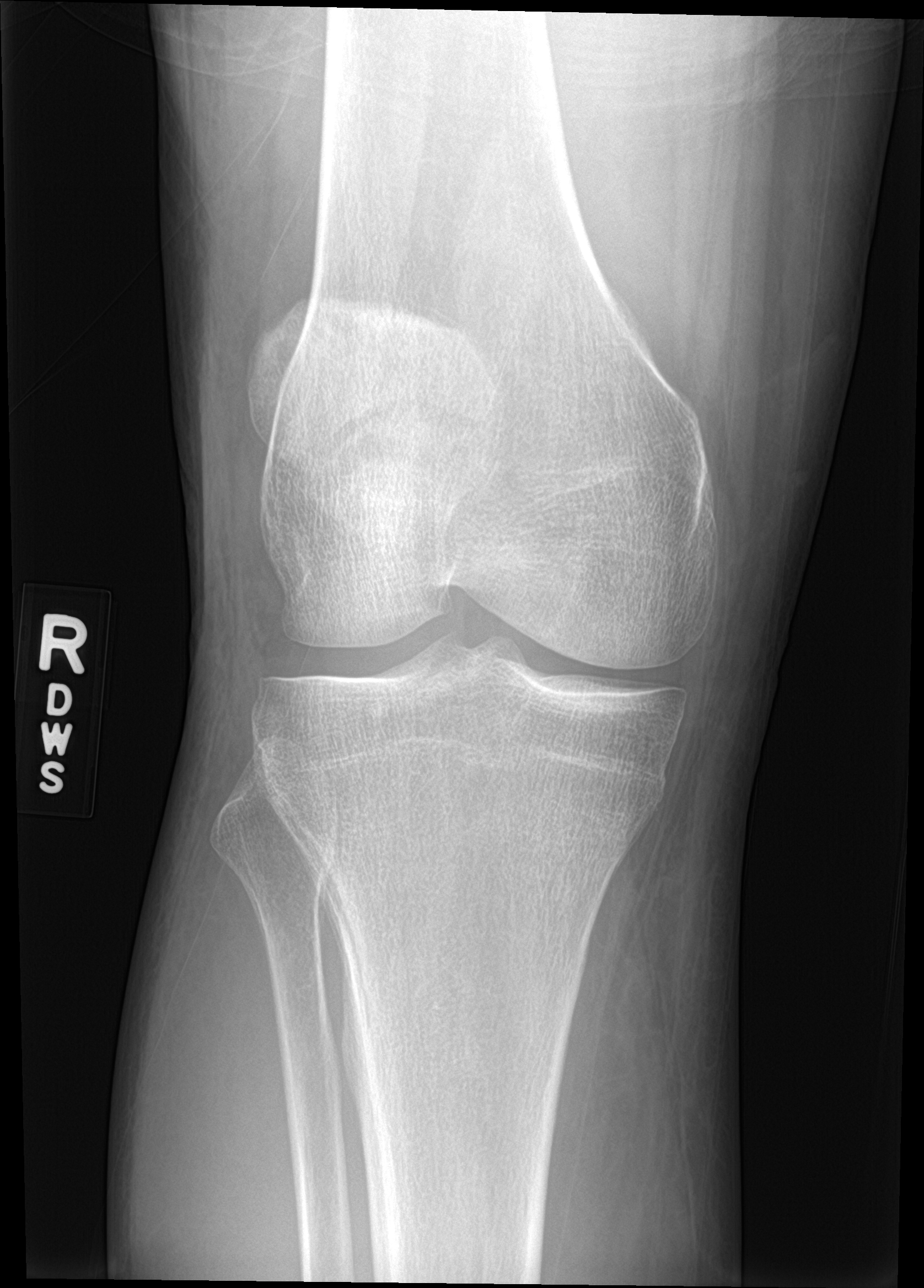

[knee obl (1 of 2)]
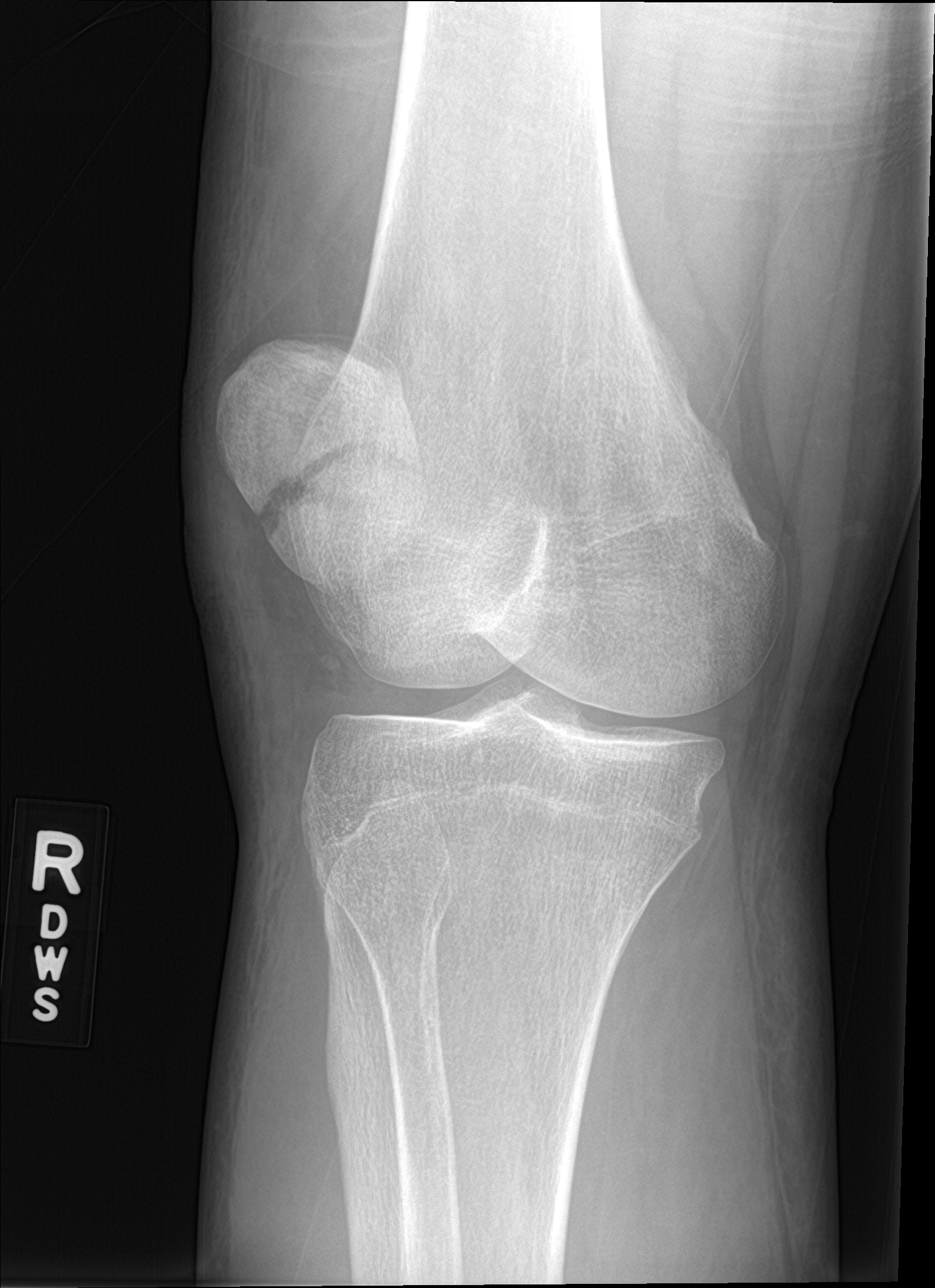

[knee obl (2 of 2)]
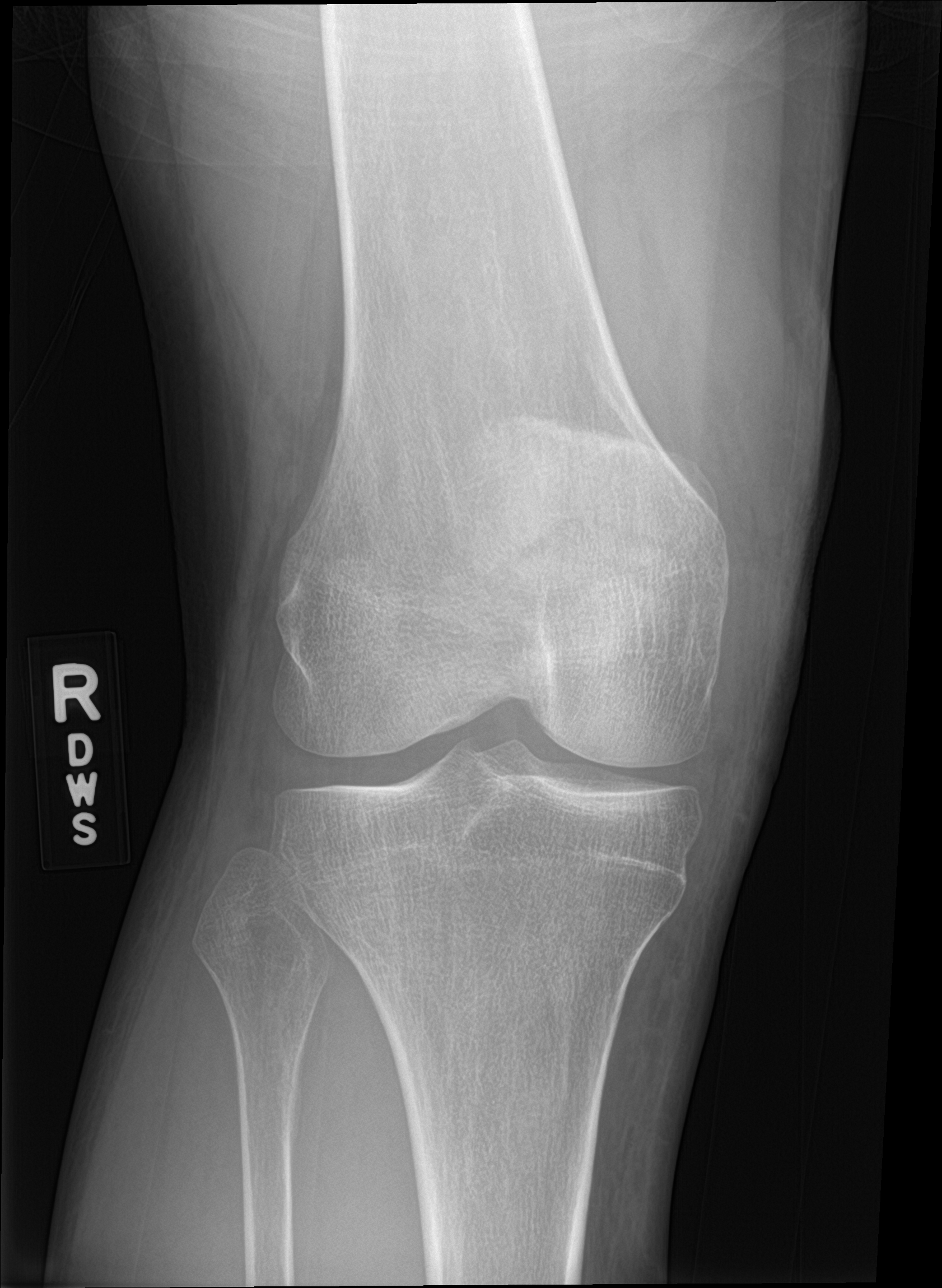

[knee lat]
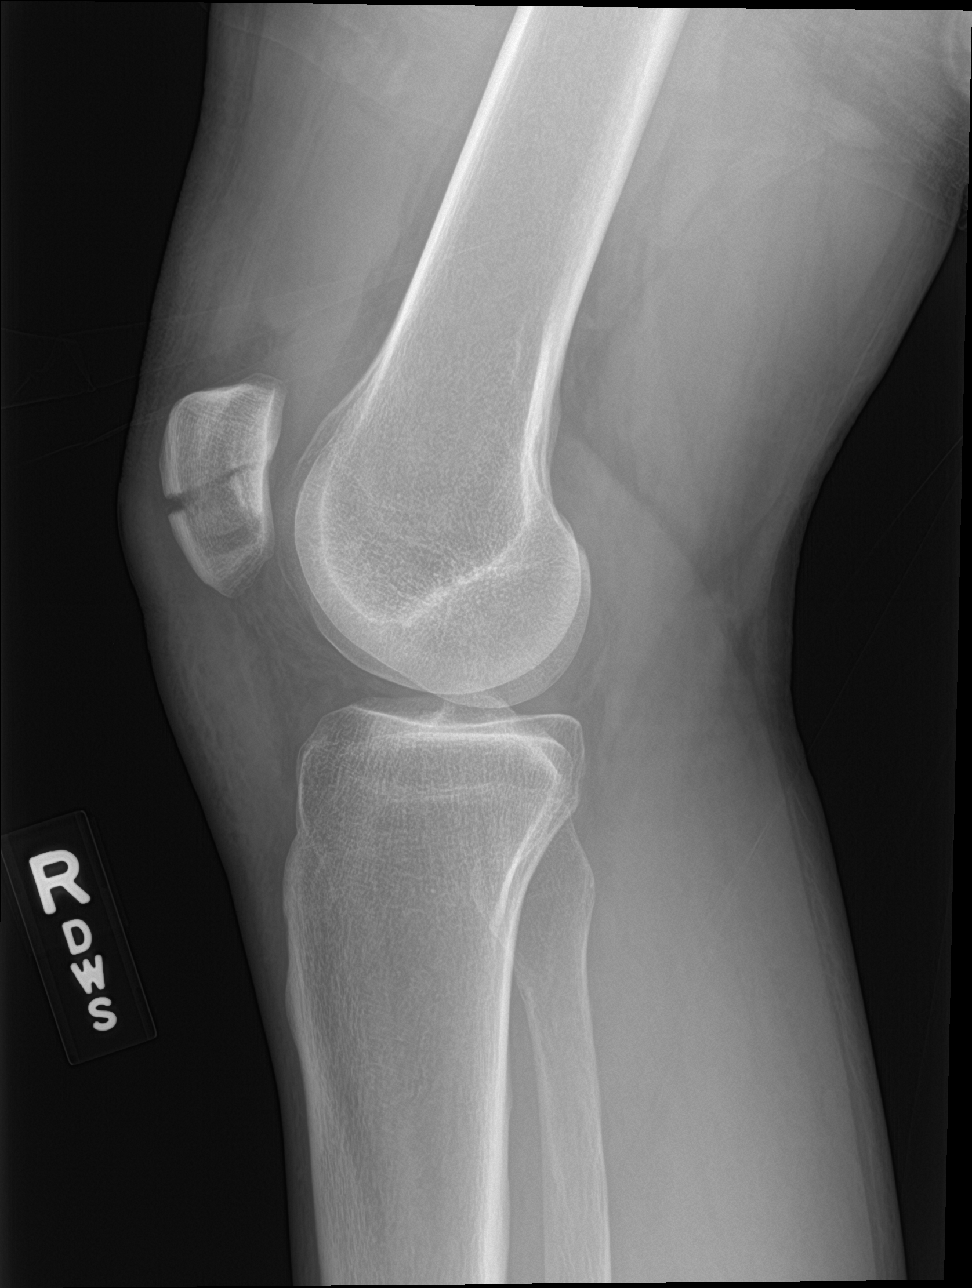

[4 of 4 positions shown; findings below may reference images not displayed]

FINDINGS: A comminuted, mildly displaced fracture of the patella is seen.
Moderate knee joint effusion also demonstrated. No other fractures
identified. No evidence of dislocation.
IMPRESSION: 1. Comminuted, mildly displaced fracture of the patella.
2. Moderate knee joint effusion.
